# Patient Record
Sex: Female | Born: 1945 | Hispanic: Refuse to answer | Marital: Married
Health system: Southern US, Community
[De-identification: ages and names within clinical notes are randomized; demographics above are authoritative.]

---

## 2014-05-05 ENCOUNTER — Other Ambulatory Visit (HOSPITAL_BASED_OUTPATIENT_CLINIC_OR_DEPARTMENT_OTHER): Payer: Self-pay | Admitting: Family Medicine

## 2014-05-05 DIAGNOSIS — M4722 Other spondylosis with radiculopathy, cervical region: Secondary | ICD-10-CM

## 2014-05-10 ENCOUNTER — Ambulatory Visit (HOSPITAL_BASED_OUTPATIENT_CLINIC_OR_DEPARTMENT_OTHER)
Admission: RE | Admit: 2014-05-10 | Discharge: 2014-05-10 | Disposition: A | Discharge status: Home / Self Care | Payer: Medicare Other | Source: Ambulatory Visit | Attending: Family Medicine | Admitting: Family Medicine

## 2014-05-10 DIAGNOSIS — G589 Mononeuropathy, unspecified: Secondary | ICD-10-CM | POA: Diagnosis not present

## 2014-05-10 DIAGNOSIS — M5031 Other cervical disc degeneration,  high cervical region: Secondary | ICD-10-CM | POA: Insufficient documentation

## 2014-05-10 DIAGNOSIS — M4802 Spinal stenosis, cervical region: Secondary | ICD-10-CM | POA: Insufficient documentation

## 2014-05-10 DIAGNOSIS — M4722 Other spondylosis with radiculopathy, cervical region: Secondary | ICD-10-CM

## 2014-05-10 DIAGNOSIS — M5032 Other cervical disc degeneration, mid-cervical region: Secondary | ICD-10-CM | POA: Diagnosis not present

## 2016-04-18 IMAGING — MR MR CERVICAL SPINE W/O CM
4 of 5 series · 29 of 48 positions shown · non-contrast
Comparison: None.

CLINICAL DATA: Cervical spondylosis with radiculopathy. There is
neck pain that radiates into the bilateral shoulders, worse on the
left.

EXAM:
MRI CERVICAL SPINE WITHOUT CONTRAST
TECHNIQUE: Multiplanar, multisequence MR imaging of the cervical spine was
performed. No intravenous contrast was administered.

[Series 2: (id) tse sag · sagittal · 3.0mm · 0.41mm/px · 7 of 15 slices shown]
[im 1/15]
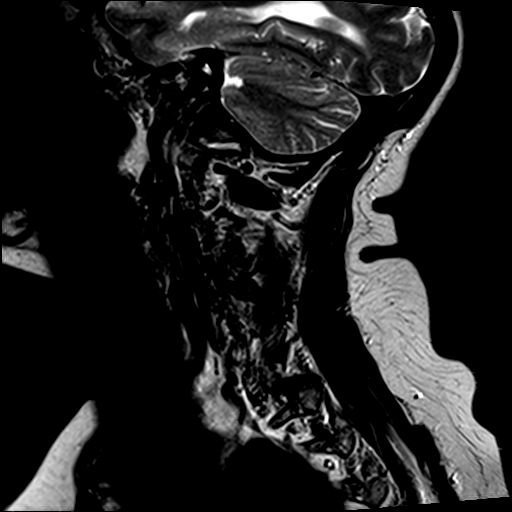
[im 3/15]
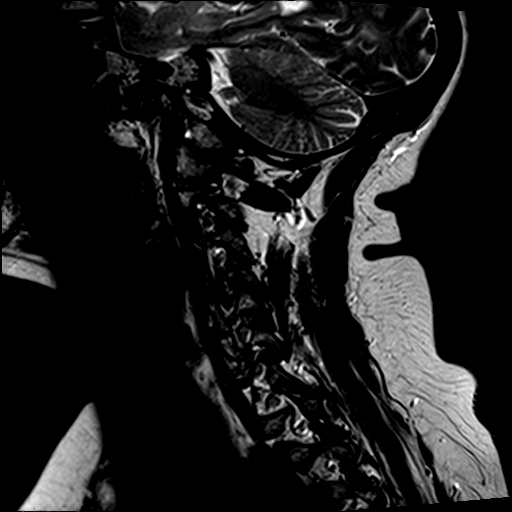
[im 5/15]
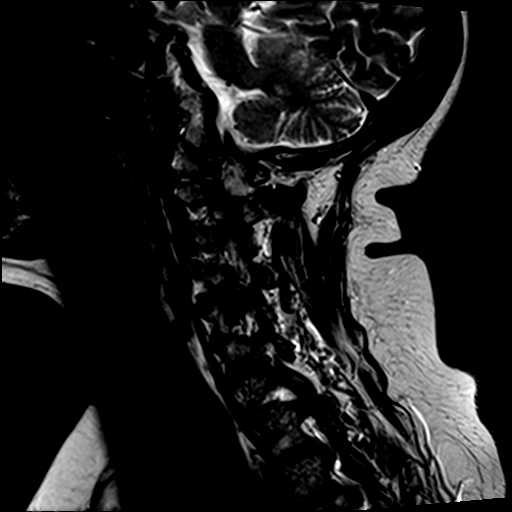
[im 8/15]
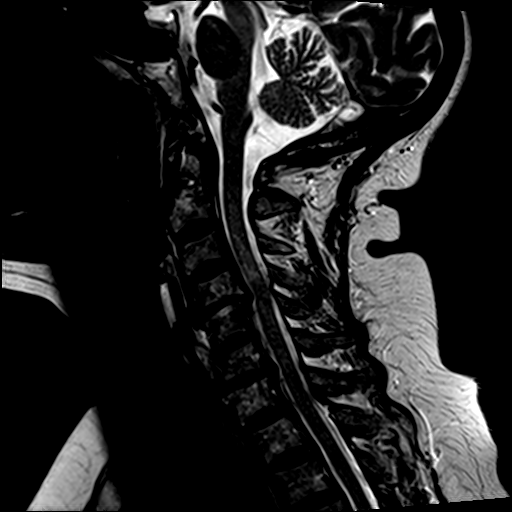
[im 10/15]
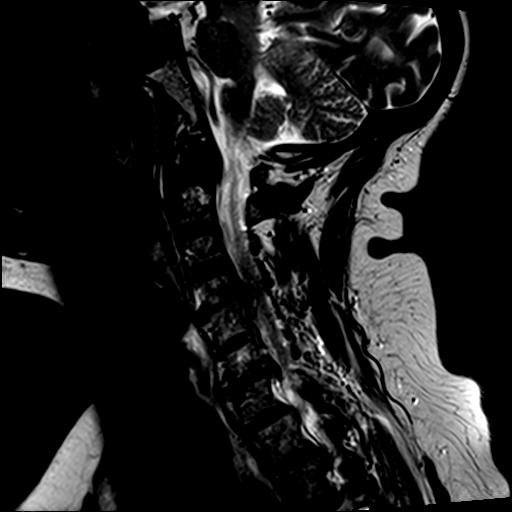
[im 12/15]
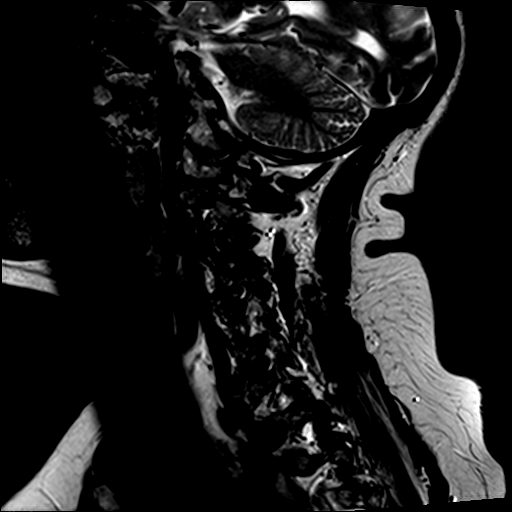
[im 15/15]
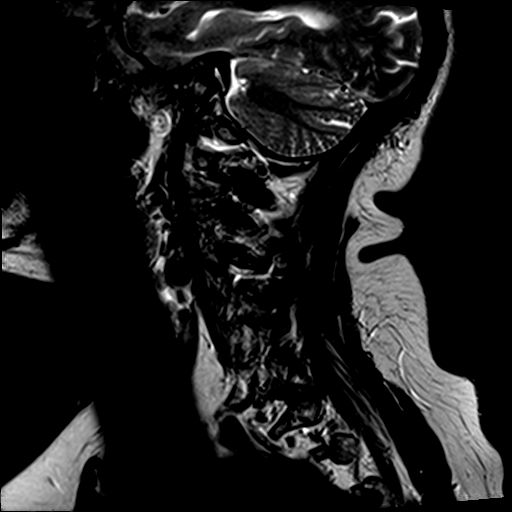

[Series 4: STIR · sagittal · 3.0mm · 0.82mm/px · 6 of 15 slices shown]
[im 1/15]
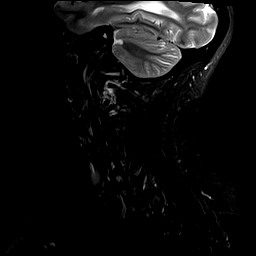
[im 3/15]
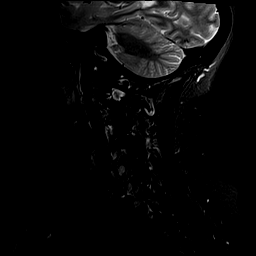
[im 6/15]
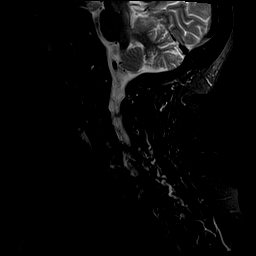
[im 9/15]
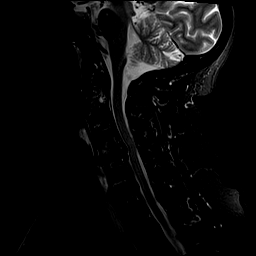
[im 12/15]
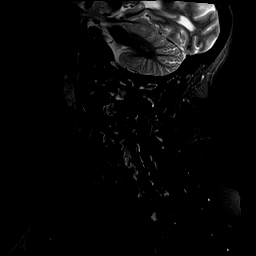
[im 15/15]
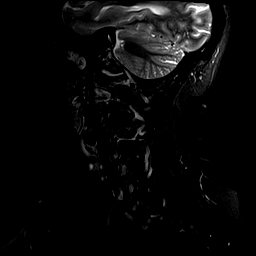

[Series 5: T2 · axial · 3.0mm · 0.39mm/px · z∈[-47,+58]mm · 8 of 34 slices shown (1 of 2)]
[im 1/34]
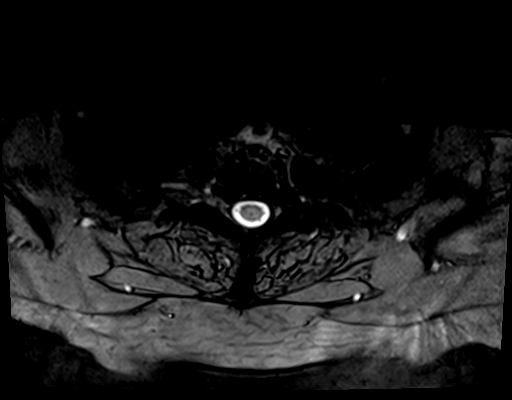
[im 6/34]
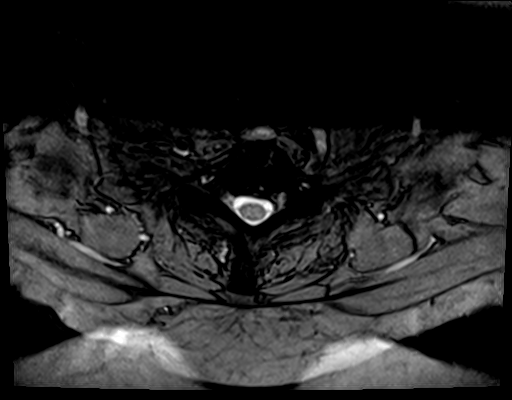
[im 11/34]
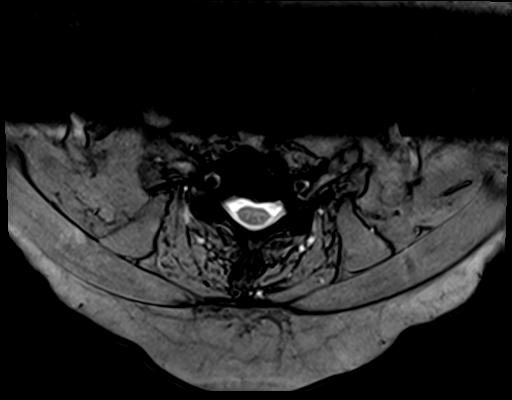
[im 16/34]
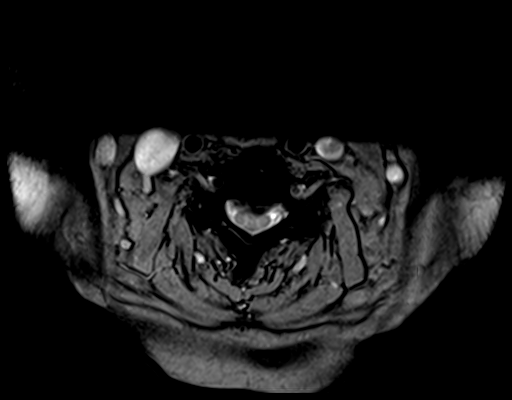
[im 18/34]
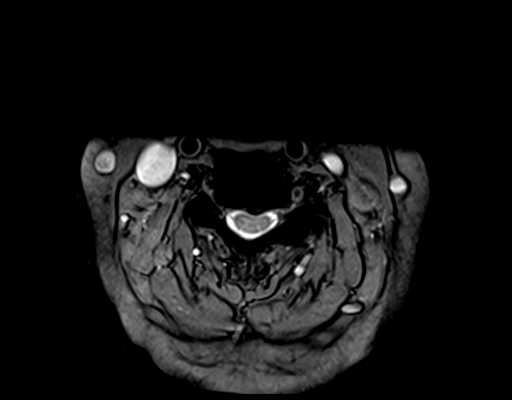
[im 23/34]
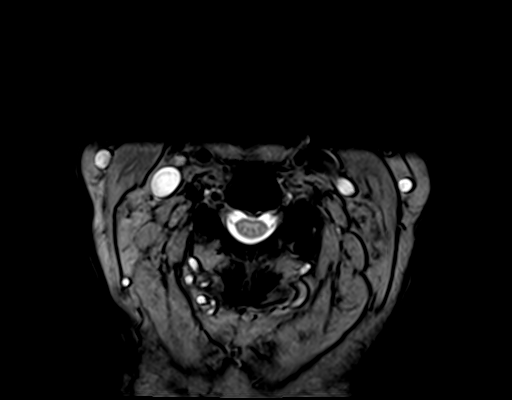
[im 28/34]
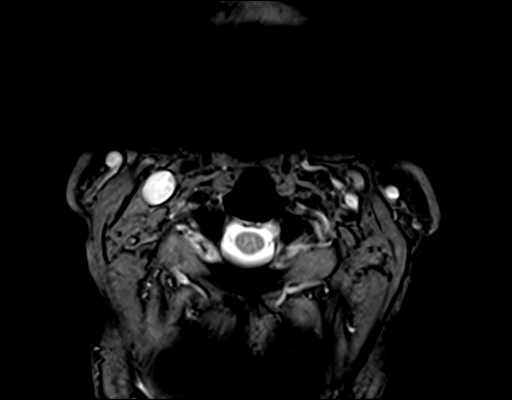
[im 34/34]
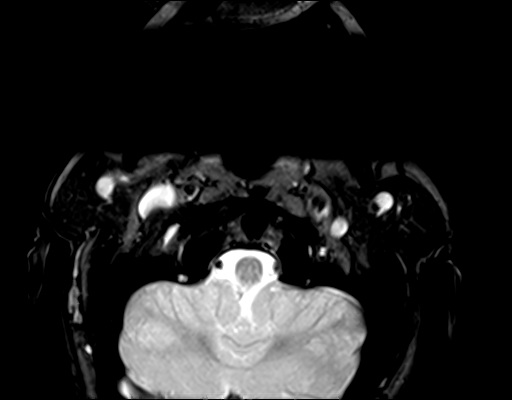

[Series 6: T2 · axial · 3.0mm · 0.62mm/px · z∈[-48,+57]mm · 8 of 34 slices shown (2 of 2)]
[im 1/34]
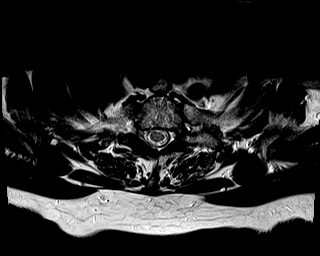
[im 6/34]
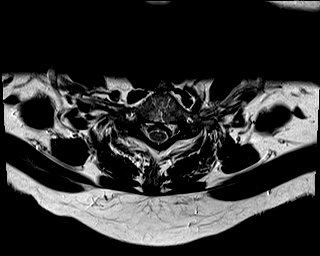
[im 11/34]
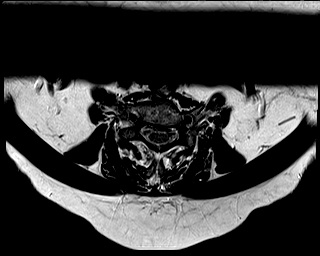
[im 16/34]
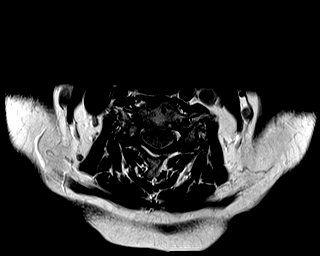
[im 18/34]
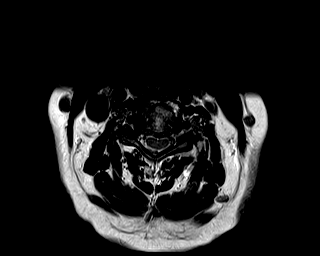
[im 23/34]
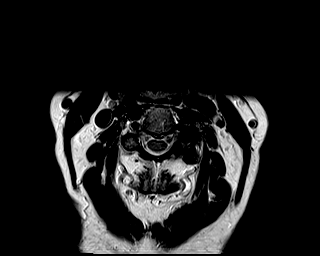
[im 28/34]
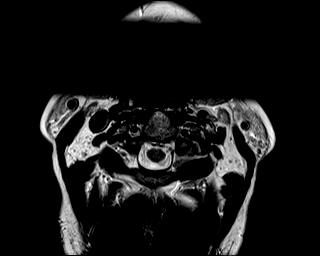
[im 34/34]
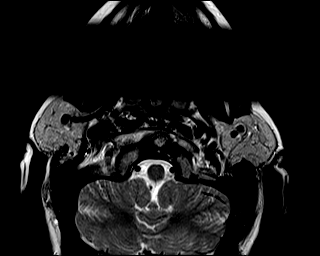

[29 of 48 positions shown; findings below may reference images not displayed]

FINDINGS: No marrow signal abnormality suggestive of fracture, infection, or
neoplasm. No normal cord signal and morphology. No extra-spinal
findings to explain pain.

Degenerative changes:

C1-C2: Nonspecific cystic change around the dens without pannus or
edematous signal.

C2-3: Facet osteoarthritis with moderate spurring on the left.
Patent canal and foramina

C3-4: Facet osteoarthritis, bulky on the left. Slight
anterolisthesis. Moderate left facet osteoarthritis, mainly at the
foraminal exit.

C4-5: Advanced degenerative disc narrowing with posterior disc
osteophyte complex contacting and mildly flattening the ventral
cord. No cord signal abnormality. Bulky facet and uncovertebral
spurring with advanced right and mild moderate left foraminal
stenosis

C5-6: Tiny central protrusion.  Patent canal and foramina

C6-7: Moderate disc narrowing. Small posterior disc osteophyte
complex effaces the ventral thecal sac. Right greater than left
uncovertebral spurring with patent left foramen and moderate right
foraminal stenosis with deflection of the nerve.

C7-T1:Facet osteoarthritis with trace anterolisthesis and spurring.
Patent canal and foramina
IMPRESSION: 1. Diffuse degenerative disc and facet disease.
2. C4-5 advanced right foraminal stenosis with C5 nerve compression.
3. C6-7 right and C3-4 left foraminal stenosis with nerve
deflection.
4. C4-5 mild spinal canal stenosis with ventral cord flattening.

## 2019-02-24 ENCOUNTER — Ambulatory Visit: Payer: Medicare Other | Attending: Internal Medicine

## 2019-02-24 DIAGNOSIS — Z23 Encounter for immunization: Secondary | ICD-10-CM | POA: Insufficient documentation

## 2019-02-24 NOTE — Progress Notes (Signed)
   Covid-[facility] Vaccination [facility]  Name:  Lachina Salsberry    MRN: [id number] DOB: 1945-12-03  02/24/2019  Ms. Nevers was observed post Covid-19 immunization for 15 minutes without incidence. She was provided with Vaccine Information Sheet and instruction to access the V-Safe system.   Ms. Waldo was instructed to call 911 with any severe reactions post vaccine: Marland Kitchen Difficulty breathing  . Swelling of your face and throat  . A fast heartbeat  . A bad rash all over your body  . Dizziness and weakness    Immunizations Administered    Name Date Dose VIS Date Route   Pfizer COVID-19 Vaccine 02/24/2019 11:03 AM 0.3 mL 12/14/2018 Intramuscular   Manufacturer: [employer], [employer]   Lot: [id number]   NDC: [address]-1

## 2019-03-20 ENCOUNTER — Ambulatory Visit: Payer: Medicare Other | Attending: Internal Medicine

## 2019-03-20 DIAGNOSIS — Z23 Encounter for immunization: Secondary | ICD-10-CM

## 2019-03-20 NOTE — Progress Notes (Signed)
   Covid-19 Vaccination Clinic  Name:  Allison Burton    MRN: [id number] DOB: 15-Nov-1945  03/20/2019  [name] was observed post Covid-19 immunization for 15 minutes without incident. She was provided with Vaccine Information Sheet and instruction to access the V-Safe system.   [name] was instructed to call 911 with any severe reactions post vaccine: Marland Kitchen Difficulty breathing  . Swelling of face and throat  . A fast heartbeat  . A bad rash all over body  . Dizziness and weakness   Immunizations Administered    Name Date Dose VIS Date Route   Pfizer COVID-19 Vaccine 03/20/2019  8:31 AM 0.3 mL 12/14/2018 Intramuscular   Manufacturer: [employer], [employer]   Lot: [id number]   NDC: [address]-5
